# Patient Record
Sex: Male | Born: 1960 | Race: Black or African American | Hispanic: No | Marital: Single | Smoking: Current every day smoker
Health system: Southern US, Community
[De-identification: ages and names within clinical notes are randomized; demographics above are authoritative.]

## PROBLEM LIST (undated history)

## (undated) DIAGNOSIS — R011 Cardiac murmur, unspecified: Secondary | ICD-10-CM

## (undated) DIAGNOSIS — B1920 Unspecified viral hepatitis C without hepatic coma: Secondary | ICD-10-CM

## (undated) DIAGNOSIS — W3400XA Accidental discharge from unspecified firearms or gun, initial encounter: Secondary | ICD-10-CM

## (undated) DIAGNOSIS — I10 Essential (primary) hypertension: Secondary | ICD-10-CM

## (undated) HISTORY — PX: CERVICAL SPINE SURGERY: SHX589

## (undated) HISTORY — PX: LEG SURGERY: SHX1003

---

## 2001-11-23 ENCOUNTER — Emergency Department (HOSPITAL_COMMUNITY): Admission: EM | Admit: 2001-11-23 | Discharge: 2001-11-24 | Payer: Self-pay

## 2001-11-25 ENCOUNTER — Emergency Department (HOSPITAL_COMMUNITY): Admission: EM | Admit: 2001-11-25 | Discharge: 2001-11-25 | Payer: Self-pay | Admitting: Emergency Medicine

## 2005-07-17 ENCOUNTER — Emergency Department (HOSPITAL_COMMUNITY): Admission: EM | Admit: 2005-07-17 | Discharge: 2005-07-17 | Payer: Self-pay | Admitting: Emergency Medicine

## 2008-03-02 ENCOUNTER — Encounter: Admission: RE | Admit: 2008-03-02 | Discharge: 2008-03-23 | Payer: Self-pay | Admitting: *Deleted

## 2009-09-21 ENCOUNTER — Ambulatory Visit: Payer: Self-pay | Admitting: Gastroenterology

## 2010-03-01 ENCOUNTER — Emergency Department (HOSPITAL_COMMUNITY)
Admission: EM | Admit: 2010-03-01 | Discharge: 2010-03-01 | Payer: Self-pay | Source: Home / Self Care | Admitting: Emergency Medicine

## 2010-03-23 ENCOUNTER — Emergency Department (HOSPITAL_COMMUNITY)
Admission: EM | Admit: 2010-03-23 | Discharge: 2010-03-23 | Payer: Self-pay | Source: Home / Self Care | Admitting: Emergency Medicine

## 2010-03-30 ENCOUNTER — Encounter
Admission: RE | Admit: 2010-03-30 | Discharge: 2010-04-03 | Payer: Self-pay | Source: Home / Self Care | Attending: Neurosurgery | Admitting: Neurosurgery

## 2010-04-10 ENCOUNTER — Ambulatory Visit: Payer: Medicaid Other | Attending: Neurosurgery | Admitting: Rehabilitative and Restorative Service Providers"

## 2010-04-10 DIAGNOSIS — M25519 Pain in unspecified shoulder: Secondary | ICD-10-CM | POA: Insufficient documentation

## 2010-04-10 DIAGNOSIS — IMO0001 Reserved for inherently not codable concepts without codable children: Secondary | ICD-10-CM | POA: Insufficient documentation

## 2010-04-10 DIAGNOSIS — M2560 Stiffness of unspecified joint, not elsewhere classified: Secondary | ICD-10-CM | POA: Insufficient documentation

## 2010-04-10 DIAGNOSIS — M542 Cervicalgia: Secondary | ICD-10-CM | POA: Insufficient documentation

## 2010-04-16 ENCOUNTER — Ambulatory Visit: Payer: Medicaid Other | Admitting: Physical Therapy

## 2010-04-18 ENCOUNTER — Ambulatory Visit: Payer: Medicaid Other | Admitting: Physical Therapy

## 2010-04-21 ENCOUNTER — Emergency Department (HOSPITAL_COMMUNITY)
Admission: EM | Admit: 2010-04-21 | Discharge: 2010-04-21 | Disposition: A | Discharge status: Home / Self Care | Payer: Medicaid Other | Attending: Emergency Medicine | Admitting: Emergency Medicine

## 2010-04-21 DIAGNOSIS — B1920 Unspecified viral hepatitis C without hepatic coma: Secondary | ICD-10-CM | POA: Insufficient documentation

## 2010-04-21 DIAGNOSIS — M5412 Radiculopathy, cervical region: Secondary | ICD-10-CM | POA: Insufficient documentation

## 2010-04-21 DIAGNOSIS — I10 Essential (primary) hypertension: Secondary | ICD-10-CM | POA: Insufficient documentation

## 2010-04-21 DIAGNOSIS — Z760 Encounter for issue of repeat prescription: Secondary | ICD-10-CM | POA: Insufficient documentation

## 2010-04-26 ENCOUNTER — Ambulatory Visit: Payer: Medicaid Other | Admitting: Gastroenterology

## 2010-04-26 DIAGNOSIS — B182 Chronic viral hepatitis C: Secondary | ICD-10-CM

## 2010-05-24 ENCOUNTER — Encounter (HOSPITAL_COMMUNITY)
Admission: RE | Admit: 2010-05-24 | Discharge: 2010-05-24 | Disposition: A | Discharge status: Home / Self Care | Payer: Medicaid Other | Source: Ambulatory Visit | Attending: Neurosurgery | Admitting: Neurosurgery

## 2010-05-24 ENCOUNTER — Ambulatory Visit (HOSPITAL_COMMUNITY)
Admission: RE | Admit: 2010-05-24 | Discharge: 2010-05-24 | Disposition: A | Discharge status: Home / Self Care | Payer: Medicaid Other | Source: Ambulatory Visit | Attending: Neurosurgery | Admitting: Neurosurgery

## 2010-05-24 ENCOUNTER — Other Ambulatory Visit (HOSPITAL_COMMUNITY): Payer: Self-pay | Admitting: Neurosurgery

## 2010-05-24 DIAGNOSIS — Z01818 Encounter for other preprocedural examination: Secondary | ICD-10-CM | POA: Insufficient documentation

## 2010-05-24 DIAGNOSIS — M47812 Spondylosis without myelopathy or radiculopathy, cervical region: Secondary | ICD-10-CM

## 2010-05-24 DIAGNOSIS — Z01810 Encounter for preprocedural cardiovascular examination: Secondary | ICD-10-CM | POA: Insufficient documentation

## 2010-05-24 DIAGNOSIS — I10 Essential (primary) hypertension: Secondary | ICD-10-CM | POA: Insufficient documentation

## 2010-05-24 DIAGNOSIS — Z01812 Encounter for preprocedural laboratory examination: Secondary | ICD-10-CM | POA: Insufficient documentation

## 2010-05-24 LAB — BASIC METABOLIC PANEL
BUN: 10 mg/dL (ref 6–23)
Calcium: 9.4 mg/dL (ref 8.4–10.5)
Creatinine, Ser: 0.82 mg/dL (ref 0.4–1.5)
GFR calc non Af Amer: >60 mL/min (ref >60)
Glucose, Bld: 122 mg/dL — ABNORMAL HIGH (ref 70–99)
Potassium: 3.5 mEq/L (ref 3.5–5.1)

## 2010-05-24 LAB — CBC
HCT: 41.9 % (ref 39.0–52.0)
Hemoglobin: 14.8 g/dL (ref 13.0–17.0)
MCH: 31.3 pg (ref 26.0–34.0)
MCHC: 35.3 g/dL (ref 30.0–36.0)

## 2010-05-24 LAB — SURGICAL PCR SCREEN: Staphylococcus aureus: POSITIVE — AB

## 2010-06-01 ENCOUNTER — Inpatient Hospital Stay (HOSPITAL_COMMUNITY): Payer: Medicaid Other

## 2010-06-01 ENCOUNTER — Inpatient Hospital Stay (HOSPITAL_COMMUNITY)
Admission: RE | Admit: 2010-06-01 | Discharge: 2010-06-02 | DRG: 473 | Disposition: A | Discharge status: Home / Self Care | Payer: Medicaid Other | Source: Ambulatory Visit | Attending: Neurosurgery | Admitting: Neurosurgery

## 2010-06-01 DIAGNOSIS — Z01812 Encounter for preprocedural laboratory examination: Secondary | ICD-10-CM

## 2010-06-01 DIAGNOSIS — M4712 Other spondylosis with myelopathy, cervical region: Principal | ICD-10-CM | POA: Diagnosis present

## 2010-06-01 DIAGNOSIS — F17200 Nicotine dependence, unspecified, uncomplicated: Secondary | ICD-10-CM | POA: Diagnosis present

## 2010-06-01 DIAGNOSIS — I10 Essential (primary) hypertension: Secondary | ICD-10-CM | POA: Diagnosis present

## 2010-06-07 NOTE — Op Note (Signed)
NAME:  Daniel Bruce, Daniel Bruce NO.:  [id number]  MEDICAL RECORD NO.:  [id number]           PATIENT TYPE:  I  LOCATION:  3526                         FACILITY:  [facility]  PHYSICIAN:  Donalee Citrin, M.D.        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  PROCEDURE:  Anterior cervical diskectomies and fusion at C3-4, C4-5, and C5-6 using the Globus PEEK cages packed with locally harvested autograft mixed with Actifuse and an Atlantis translational plate with 8 fixed angle 30-mm screws.  SURGEON:  Donalee Citrin, M.D.  Threasa HeadsYetta Barre.  ANESTHESIA:  General endotracheal.  HISTORY OF PRESENT ILLNESS:  The patient is a very pleasant 50 year old gentleman who has had progressive worsening of neck and predominantly right shoulder pain bringing down to his forearm that was refractory to all forms of treatment.  MRI scan showed severe cervical stenosis from a combination of osteophytes, disk bulges, and kyphosis predominantly centered around the C3-4, C4-5, and C5-6 disk spaces, so due to the patient's failure to conservative treatment, MRI findings, and clinical exam, the patient was recommended anterior cervical diskectomies and fusion.  We explained the risks and benefits of the operation.  The patient understood and agreed to proceed forth.  The patient was brought into the OR, induced under anesthesia, positioned supine and the neck in slight extension, and 5 pounds of halter traction.  The right side of his neck was prepped and draped in the usual sterile fashion.  Preoperative x-ray localized the appropriate level, so a curvilinear incision was made just above the midline to the anterior border of sternocleidomastoid and superficial layer of the platysma.  The platysma was dissected and divided  longitudinally.  The avascular plane to sternomastoid and strap muscle was developed down to the prevertebral fascia.  The prevertebral fascia was dissected with the Kittner.  Intraoperative x-ray identified the C4-5 disk space, so annulotomy was made with a 15 blade scalpel to mark the disk space and self-retaining retractor was placed after the longus colli was reflected laterally.  All the three disk spaces were incised.  Large anterior osteophytes were bitten off with Leksell rongeur and 2- and 3-mm Kerrison punch.  All the three disk spaces were then sequentially drilled down the posterior  annulus and osteophyte complex capturing the bone shavings in a mucus trap.  Then at this point, the operating microscope was draped, brought into the field under microscopic illumination first working at C3-4.  The C3-4 disk space was drilled down the posterior annulus and osteophyte complex.  Using 1- and 2-mm Kerrison punch aggressive underbiting of both C3 and C4 endplates was carried out identifying the PLL, which was removed in piecemeal fashion exposing the thecal sac.  Marked stenosis at this level coming off from a large bone spur, C3 vertebral body was predominantly visualized.  This was all aggressively underbitten decompressing the central canal and marching across laterally.  The C4 pedicle was palpated.  The C4 foramen was  unroofed to mark the lateral margin of the disk space.  After adequate decompression of this disk space level was achieved, an 8-mm PEEK cage packed with local autograft mixed with Actifuse was then inserted 2 mm deep to the anterior vertebral body line.  Next, working at C5-6 in a similar fashion, the disk space was drilled down capturing the shavings in mucus trap and then aggressive underbiting of both C5 and C6 vertebral bodies were carried out decompressing the central canal and marching across laterally.  C6 pedicles identified.  C6 nerve roots were unroofed.   Predominant uncinate hypertrophy at this level.  Then again an 8 graft was inserted in similar fashion.  Then working at C4-5, the C4-5 was drilled down the posterior annulus and osteophyte complex. Large bones were coming off the C4 vertebral body displacing the right side of the spinal cord and right C5 nerve root was also identified. This was porotic, means symptomatic radicular lesions and it was all aggressively underbitten and decompressed, decompressing both C5 neural foramen and again end plates were scraped.  An 8-mm PEEK cage was inserted.  At this point, the Atlantis translational plate was placed. All screws had excellent purchase.  Locking mechanisms were engaged. The wound was then copiously irrigated and meticulous hemostasis was maintained.  A 7-mm fully fluted flat JP was placed and the wound was closed in layers with interrupted Vicryl and skin was closed with a running 4-0 subcuticular.  Benzoin and Steri-Strips were applied.  The patient went to recovery room in stable condition.  At the end of the case, sponge and instrument count were correct.          ______________________________ Donalee Citrin, M.D.     GC/MEDQ  D:  06/01/2010  T:  06/01/2010  Job:  [id number]  Electronically Signed by Donalee Citrin M.D. on 06/07/2010 08:37:03 AM

## 2010-07-12 ENCOUNTER — Other Ambulatory Visit: Payer: Self-pay | Admitting: Neurosurgery

## 2010-07-12 ENCOUNTER — Ambulatory Visit
Admission: RE | Admit: 2010-07-12 | Discharge: 2010-07-12 | Disposition: A | Discharge status: Home / Self Care | Payer: Medicaid Other | Source: Ambulatory Visit | Attending: Neurosurgery | Admitting: Neurosurgery

## 2010-07-12 DIAGNOSIS — M542 Cervicalgia: Secondary | ICD-10-CM

## 2010-07-16 ENCOUNTER — Ambulatory Visit
Admission: RE | Admit: 2010-07-16 | Discharge: 2010-07-16 | Disposition: A | Discharge status: Home / Self Care | Payer: Medicaid Other | Source: Ambulatory Visit | Attending: Neurosurgery | Admitting: Neurosurgery

## 2010-07-16 DIAGNOSIS — M542 Cervicalgia: Secondary | ICD-10-CM

## 2010-08-21 ENCOUNTER — Emergency Department (HOSPITAL_COMMUNITY)
Admission: EM | Admit: 2010-08-21 | Discharge: 2010-08-21 | Disposition: A | Discharge status: Home / Self Care | Payer: Medicaid Other | Attending: Emergency Medicine | Admitting: Emergency Medicine

## 2010-08-21 DIAGNOSIS — B1920 Unspecified viral hepatitis C without hepatic coma: Secondary | ICD-10-CM | POA: Insufficient documentation

## 2010-08-21 DIAGNOSIS — M542 Cervicalgia: Secondary | ICD-10-CM | POA: Insufficient documentation

## 2010-08-21 DIAGNOSIS — R110 Nausea: Secondary | ICD-10-CM | POA: Insufficient documentation

## 2010-08-21 DIAGNOSIS — I10 Essential (primary) hypertension: Secondary | ICD-10-CM | POA: Insufficient documentation

## 2010-08-21 DIAGNOSIS — G8929 Other chronic pain: Secondary | ICD-10-CM | POA: Insufficient documentation

## 2010-08-21 DIAGNOSIS — M5412 Radiculopathy, cervical region: Secondary | ICD-10-CM | POA: Insufficient documentation

## 2010-08-30 ENCOUNTER — Ambulatory Visit
Admission: RE | Admit: 2010-08-30 | Discharge: 2010-08-30 | Disposition: A | Discharge status: Home / Self Care | Payer: Medicaid Other | Source: Ambulatory Visit | Attending: Neurosurgery | Admitting: Neurosurgery

## 2010-08-30 ENCOUNTER — Other Ambulatory Visit: Payer: Self-pay | Admitting: Neurosurgery

## 2010-08-30 DIAGNOSIS — M542 Cervicalgia: Secondary | ICD-10-CM

## 2010-09-02 ENCOUNTER — Emergency Department (HOSPITAL_COMMUNITY)
Admission: EM | Admit: 2010-09-02 | Discharge: 2010-09-02 | Disposition: A | Discharge status: Home / Self Care | Payer: Medicaid Other | Attending: Emergency Medicine | Admitting: Emergency Medicine

## 2010-09-02 DIAGNOSIS — M129 Arthropathy, unspecified: Secondary | ICD-10-CM | POA: Insufficient documentation

## 2010-09-02 DIAGNOSIS — Z8619 Personal history of other infectious and parasitic diseases: Secondary | ICD-10-CM | POA: Insufficient documentation

## 2010-09-02 DIAGNOSIS — M5412 Radiculopathy, cervical region: Secondary | ICD-10-CM | POA: Insufficient documentation

## 2010-09-02 DIAGNOSIS — I10 Essential (primary) hypertension: Secondary | ICD-10-CM | POA: Insufficient documentation

## 2010-09-02 DIAGNOSIS — Z79899 Other long term (current) drug therapy: Secondary | ICD-10-CM | POA: Insufficient documentation

## 2010-09-02 DIAGNOSIS — R209 Unspecified disturbances of skin sensation: Secondary | ICD-10-CM | POA: Insufficient documentation

## 2010-09-02 DIAGNOSIS — M79609 Pain in unspecified limb: Secondary | ICD-10-CM | POA: Insufficient documentation

## 2010-09-02 DIAGNOSIS — M542 Cervicalgia: Secondary | ICD-10-CM | POA: Insufficient documentation

## 2010-09-10 ENCOUNTER — Emergency Department (HOSPITAL_COMMUNITY)
Admission: EM | Admit: 2010-09-10 | Discharge: 2010-09-10 | Disposition: A | Discharge status: Home / Self Care | Payer: Medicaid Other | Attending: Emergency Medicine | Admitting: Emergency Medicine

## 2010-09-10 DIAGNOSIS — M542 Cervicalgia: Secondary | ICD-10-CM | POA: Insufficient documentation

## 2010-09-10 DIAGNOSIS — I10 Essential (primary) hypertension: Secondary | ICD-10-CM | POA: Insufficient documentation

## 2010-09-10 DIAGNOSIS — Z87828 Personal history of other (healed) physical injury and trauma: Secondary | ICD-10-CM | POA: Insufficient documentation

## 2010-09-10 DIAGNOSIS — Z79899 Other long term (current) drug therapy: Secondary | ICD-10-CM | POA: Insufficient documentation

## 2010-09-10 DIAGNOSIS — G8929 Other chronic pain: Secondary | ICD-10-CM | POA: Insufficient documentation

## 2010-09-19 ENCOUNTER — Emergency Department (HOSPITAL_COMMUNITY)
Admission: EM | Admit: 2010-09-19 | Discharge: 2010-09-19 | Disposition: A | Discharge status: Home / Self Care | Payer: Medicaid Other | Attending: Emergency Medicine | Admitting: Emergency Medicine

## 2010-09-19 DIAGNOSIS — Z8619 Personal history of other infectious and parasitic diseases: Secondary | ICD-10-CM | POA: Insufficient documentation

## 2010-09-19 DIAGNOSIS — Z79899 Other long term (current) drug therapy: Secondary | ICD-10-CM | POA: Insufficient documentation

## 2010-09-19 DIAGNOSIS — R509 Fever, unspecified: Secondary | ICD-10-CM | POA: Insufficient documentation

## 2010-09-19 DIAGNOSIS — I10 Essential (primary) hypertension: Secondary | ICD-10-CM | POA: Insufficient documentation

## 2010-09-19 DIAGNOSIS — M129 Arthropathy, unspecified: Secondary | ICD-10-CM | POA: Insufficient documentation

## 2010-09-19 DIAGNOSIS — R209 Unspecified disturbances of skin sensation: Secondary | ICD-10-CM | POA: Insufficient documentation

## 2010-09-19 DIAGNOSIS — M542 Cervicalgia: Secondary | ICD-10-CM | POA: Insufficient documentation

## 2010-09-21 ENCOUNTER — Encounter (HOSPITAL_COMMUNITY)
Admission: RE | Admit: 2010-09-21 | Discharge: 2010-09-21 | Disposition: A | Discharge status: Home / Self Care | Payer: Medicaid Other | Source: Ambulatory Visit | Attending: Neurosurgery | Admitting: Neurosurgery

## 2010-09-21 LAB — BASIC METABOLIC PANEL
CO2: 32 mEq/L (ref 19–32)
Calcium: 9.8 mg/dL (ref 8.4–10.5)
Creatinine, Ser: 0.92 mg/dL (ref 0.50–1.35)
GFR calc non Af Amer: >60 mL/min (ref >60)
Sodium: 140 mEq/L (ref 135–145)

## 2010-09-21 LAB — CBC
MCH: 31.8 pg (ref 26.0–34.0)
MCHC: 36 g/dL (ref 30.0–36.0)
MCV: 88.4 fL (ref 78.0–100.0)
Platelets: 215 10*3/uL (ref 150–400)
RBC: 4.4 MIL/uL (ref 4.22–5.81)

## 2010-09-21 LAB — SURGICAL PCR SCREEN: MRSA, PCR: NEGATIVE

## 2010-09-26 ENCOUNTER — Observation Stay (HOSPITAL_COMMUNITY): Payer: Medicaid Other

## 2010-09-26 ENCOUNTER — Inpatient Hospital Stay (HOSPITAL_COMMUNITY)
Admission: RE | Admit: 2010-09-26 | Discharge: 2010-09-27 | DRG: 473 | Disposition: A | Discharge status: Home / Self Care | Payer: Medicaid Other | Source: Ambulatory Visit | Attending: Neurosurgery | Admitting: Neurosurgery

## 2010-09-26 DIAGNOSIS — Z472 Encounter for removal of internal fixation device: Secondary | ICD-10-CM

## 2010-09-26 DIAGNOSIS — Z981 Arthrodesis status: Secondary | ICD-10-CM

## 2010-09-26 DIAGNOSIS — I10 Essential (primary) hypertension: Secondary | ICD-10-CM | POA: Diagnosis present

## 2010-09-26 DIAGNOSIS — M5020 Other cervical disc displacement, unspecified cervical region: Principal | ICD-10-CM | POA: Diagnosis present

## 2010-09-26 DIAGNOSIS — M129 Arthropathy, unspecified: Secondary | ICD-10-CM | POA: Diagnosis present

## 2010-09-26 DIAGNOSIS — F17200 Nicotine dependence, unspecified, uncomplicated: Secondary | ICD-10-CM | POA: Diagnosis present

## 2010-09-26 DIAGNOSIS — Z01812 Encounter for preprocedural laboratory examination: Secondary | ICD-10-CM

## 2010-09-26 DIAGNOSIS — Z79899 Other long term (current) drug therapy: Secondary | ICD-10-CM

## 2010-10-03 ENCOUNTER — Emergency Department (HOSPITAL_COMMUNITY)
Admission: EM | Admit: 2010-10-03 | Discharge: 2010-10-03 | Disposition: A | Discharge status: Home / Self Care | Payer: Medicaid Other | Attending: Emergency Medicine | Admitting: Emergency Medicine

## 2010-10-03 ENCOUNTER — Emergency Department (HOSPITAL_COMMUNITY): Payer: Medicaid Other

## 2010-10-03 DIAGNOSIS — M542 Cervicalgia: Secondary | ICD-10-CM | POA: Insufficient documentation

## 2010-10-03 DIAGNOSIS — Z8619 Personal history of other infectious and parasitic diseases: Secondary | ICD-10-CM | POA: Insufficient documentation

## 2010-10-03 DIAGNOSIS — R079 Chest pain, unspecified: Secondary | ICD-10-CM | POA: Insufficient documentation

## 2010-10-03 DIAGNOSIS — M129 Arthropathy, unspecified: Secondary | ICD-10-CM | POA: Insufficient documentation

## 2010-10-03 DIAGNOSIS — G8918 Other acute postprocedural pain: Secondary | ICD-10-CM | POA: Insufficient documentation

## 2010-10-03 DIAGNOSIS — I10 Essential (primary) hypertension: Secondary | ICD-10-CM | POA: Insufficient documentation

## 2010-10-03 DIAGNOSIS — Z79899 Other long term (current) drug therapy: Secondary | ICD-10-CM | POA: Insufficient documentation

## 2010-10-03 LAB — CBC
HCT: 39.8 % (ref 39.0–52.0)
MCHC: 34.9 g/dL (ref 30.0–36.0)
MCV: 87.3 fL (ref 78.0–100.0)
Platelets: 286 10*3/uL (ref 150–400)
RDW: 12.5 % (ref 11.5–15.5)

## 2010-10-03 LAB — DIFFERENTIAL
Basophils Absolute: 0 10*3/uL (ref 0.0–0.1)
Eosinophils Absolute: 0.1 10*3/uL (ref 0.0–0.7)
Eosinophils Relative: 3 % (ref 0–5)
Lymphocytes Relative: 46 % (ref 12–46)
Lymphs Abs: 1.7 10*3/uL (ref 0.7–4.0)
Monocytes Absolute: 0.6 10*3/uL (ref 0.1–1.0)

## 2010-10-03 LAB — BASIC METABOLIC PANEL
CO2: 30 mEq/L (ref 19–32)
Creatinine, Ser: 0.67 mg/dL (ref 0.50–1.35)
GFR calc Af Amer: >60 mL/min (ref >60)

## 2010-10-03 LAB — TROPONIN I: Troponin I: <0.3 ng/mL (ref <0.30)

## 2010-10-03 MED ORDER — IOHEXOL 300 MG/ML  SOLN: 80.0000 mL | Freq: Once | INTRAMUSCULAR | Status: DC | PRN

## 2010-10-17 NOTE — Op Note (Signed)
NAME:  Daniel Bruce, Daniel Bruce NO.:  [id number]  MEDICAL RECORD NO.:  [id number]  LOCATION:  3533                         FACILITY:  [facility]  PHYSICIAN:  Donalee Citrin, M.D.        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  PROCEDURE: 1. Anterior cervical diskectomy and fusion at C6-7 using the Globus     PEEK cage, packed with locally harvested autograft mixed with     Actifuse, and an Atlantis Venture plate with two 16-XW rescue     screws and two 32-mm variable angle screws. 2. Exploration of fusion, removal of hardware, C3-C6.  SURGEON:  Donalee Citrin, MD.  ASSISTANT:  Tia Alert, MD.  ANESTHESIA:  General endotracheal.  HISTORY OF PRESENT ILLNESS:  The patient is a very pleasant 50-year gentleman, who several months ago underwent ACDF at C3-C6, very successfully has solid bony fusion.  However, several weeks to months ago, he developed progressive right arm pain consistent with C7 nerve root pattern.  Workup with MRI scan revealed a large disk herniation displacing the right C7 nerve root and the disk space below the fusion at C6-7.  The patient had weakness of his triceps.  It was recommended anterior cervical diskectomy and fusion after imaging proved or showed that the C3-C6 fusion appeared to be solid.  The patient is recommended exploration of fusion, removal of hardware, and ACDF at C6-7.  Risks and benefits of the operation were explained to the patient.  He understood and agreed to proceed forward.  DESCRIPTION OF PROCEDURE:  The patient was brought to the OR, induced under general anesthesia, positioned supine, with the neck in slight extension with 5 pounds of Halter traction.  The right side of his neck was prepped and draped in usual sterile fashion.  His old incision was lined up with  x-ray and felt to be adequate for doing the C6-7 disk spaces as well as removing the hardware from C3-C6, so his old incision was opened up and scar tissues dissected free and this was divided longitudinally.  The avascular plane to sternomastoid and strap muscle was developed down to the prevertebral fascia.  Prevertebral fascia was dissected off the old plate.  The locking mechanisms were disengaged. Screws removed and the plate was removed.  Fusion was inspected and felt to be solid.  Screw holes were then waxed.  The C6-7 disk space was identified.  Longus colli was reflected laterally and self-retaining retractor was placed.  There was a large anterior osteophyte coming off the C6 vertebral bodies, was bitten away with 2-mm Kerrison punch. Then, the disk space was drilled down, capturing bone shavings in mucus trap.  Then, the operating microscope was draped, brought into the field under microscopic illumination, the undersurface of both endplates were further underbitten the posterior annulus and posterior longitudinal ligament was removed in piecemeal fashion.  This identified the dura and aggressive underbiting at both endplates, decompressed the central canal.  First, the left side C7 pedicle was identified, the left C7 nerve roots were  skeletonized, flush to pedicle, and marching across through the right C7 neural foramen.  Several large free fragment disks were immediately teased out the neural foramen with a nerve hook.  This decompressed the C7 nerve root on the right, and then after adequate underbiting of both endplates, the central decompression was achieved and both foramina were easily accepting the nerve hook confirm patency. The wound was then copiously irrigated.  Meticulous hemostasis was maintained.  The endplates were scraped and an 8-mm PEEK cage, packed with locally harvested autograft mixed with Actifuse was inserted, approximately 2 mm deep to the anterior  vertebral line.  Then, a 27-mm Atlantis Venture plate was placed.  Two rescue screws were placed in the old holes of T6.  Two new screw holes were drilled at C7.  All screws had excellent purchase.  Locking mechanism was engaged.  Then, JP drain was placed.  The wound was closed in layers with Vicryl in the platysma and a running 4-0 subcuticular.  Benzoin and Steri-Strips were applied. The patient went to recovery room in stable condition.  At the end of the case, sponge and instrument counts were correct.          ______________________________ Donalee Citrin, M.D.     GC/MEDQ  D:  09/26/2010  T:  09/26/2010  Job:  [id number]  Electronically Signed by Donalee Citrin M.D. on 10/17/2010 10:17:21 AM
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
## 2010-10-25 ENCOUNTER — Other Ambulatory Visit: Payer: Medicaid Other | Admitting: Gastroenterology

## 2010-10-25 DIAGNOSIS — B182 Chronic viral hepatitis C: Secondary | ICD-10-CM

## 2010-10-30 ENCOUNTER — Ambulatory Visit
Admission: RE | Admit: 2010-10-30 | Discharge: 2010-10-30 | Disposition: A | Discharge status: Home / Self Care | Payer: Medicaid Other | Source: Ambulatory Visit | Attending: Neurosurgery | Admitting: Neurosurgery

## 2010-10-30 ENCOUNTER — Other Ambulatory Visit: Payer: Self-pay | Admitting: Neurosurgery

## 2010-10-30 DIAGNOSIS — M542 Cervicalgia: Secondary | ICD-10-CM

## 2010-11-29 NOTE — Discharge Summary (Signed)
  NAME:  Daniel Bruce, Daniel Bruce NO.:  [id number]  MEDICAL RECORD NO.:  [id number]  LOCATION:  3533                         FACILITY:  [facility]  PHYSICIAN:  Donalee Citrin, M.D.        DATE OF BIRTH:  20-Jul-1960  DATE OF ADMISSION:  09/26/2010 DATE OF DISCHARGE:  09/27/2010                              DISCHARGE SUMMARY   ADMITTING DIAGNOSIS:  Cervical spondylosis and myeloradiculopathy.  DISCHARGE DIAGNOSIS:  Cervical spondylosis and myeloradiculopathy.  HOSPITAL COURSE:  The patient's 50-year gentleman who was admitted as an early-morning admission, went to the operating room with the aforementioned procedure.  Postoperatively, the patient did very well, recovering in the floor.  On the floor, the patient was ambulating and voiding spontaneously, tolerated regular diet.  Arm pain was significantly improved.  Neck pain was also better.  His JP drain was discontinued on the day of discharge.  His wound was clean and dry and he was discharged with scheduled followup in approximately 2 weeks.          ______________________________ Donalee Citrin, M.D.     GC/MEDQ  D:  10/17/2010  T:  10/17/2010  Job:  [id number]  Electronically Signed by Donalee Citrin M.D. on 11/29/2010 01:03:12 PM

## 2010-12-11 ENCOUNTER — Other Ambulatory Visit: Payer: Self-pay | Admitting: Neurosurgery

## 2010-12-11 ENCOUNTER — Ambulatory Visit
Admission: RE | Admit: 2010-12-11 | Discharge: 2010-12-11 | Disposition: A | Discharge status: Home / Self Care | Payer: Medicaid Other | Source: Ambulatory Visit | Attending: Neurosurgery | Admitting: Neurosurgery

## 2010-12-11 DIAGNOSIS — M542 Cervicalgia: Secondary | ICD-10-CM

## 2011-03-12 ENCOUNTER — Other Ambulatory Visit: Payer: Self-pay | Admitting: Neurosurgery

## 2011-03-12 ENCOUNTER — Ambulatory Visit
Admission: RE | Admit: 2011-03-12 | Discharge: 2011-03-12 | Disposition: A | Discharge status: Home / Self Care | Payer: Medicaid Other | Source: Ambulatory Visit | Attending: Neurosurgery | Admitting: Neurosurgery

## 2011-03-12 DIAGNOSIS — M542 Cervicalgia: Secondary | ICD-10-CM

## 2011-12-18 ENCOUNTER — Other Ambulatory Visit: Payer: Self-pay | Admitting: Neurosurgery

## 2011-12-18 DIAGNOSIS — M545 Low back pain: Secondary | ICD-10-CM

## 2011-12-23 ENCOUNTER — Ambulatory Visit
Admission: RE | Admit: 2011-12-23 | Discharge: 2011-12-23 | Disposition: A | Discharge status: Home / Self Care | Payer: Medicaid Other | Source: Ambulatory Visit | Attending: Neurosurgery | Admitting: Neurosurgery

## 2011-12-23 DIAGNOSIS — M545 Low back pain: Secondary | ICD-10-CM

## 2012-06-25 ENCOUNTER — Emergency Department (HOSPITAL_COMMUNITY)
Admission: EM | Admit: 2012-06-25 | Discharge: 2012-06-25 | Disposition: A | Discharge status: Home / Self Care | Payer: Medicaid Other | Attending: Emergency Medicine | Admitting: Emergency Medicine

## 2012-06-25 ENCOUNTER — Encounter (HOSPITAL_COMMUNITY): Payer: Self-pay

## 2012-06-25 DIAGNOSIS — Z87828 Personal history of other (healed) physical injury and trauma: Secondary | ICD-10-CM | POA: Insufficient documentation

## 2012-06-25 DIAGNOSIS — I10 Essential (primary) hypertension: Secondary | ICD-10-CM | POA: Insufficient documentation

## 2012-06-25 DIAGNOSIS — M549 Dorsalgia, unspecified: Secondary | ICD-10-CM | POA: Insufficient documentation

## 2012-06-25 DIAGNOSIS — F17200 Nicotine dependence, unspecified, uncomplicated: Secondary | ICD-10-CM | POA: Insufficient documentation

## 2012-06-25 DIAGNOSIS — R011 Cardiac murmur, unspecified: Secondary | ICD-10-CM | POA: Insufficient documentation

## 2012-06-25 HISTORY — DX: Accidental discharge from unspecified firearms or gun, initial encounter: W34.00XA

## 2012-06-25 HISTORY — DX: Cardiac murmur, unspecified: R01.1

## 2012-06-25 HISTORY — DX: Essential (primary) hypertension: I10

## 2012-06-25 MED ORDER — IBUPROFEN 800 MG PO TABS: 800.0000 mg | ORAL_TABLET | Freq: Three times a day (TID) | ORAL | Status: DC

## 2012-06-25 MED ORDER — OXYCODONE-ACETAMINOPHEN 5-325 MG PO TABS: ORAL_TABLET | ORAL | Status: DC
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
## 2012-06-25 NOTE — ED Notes (Signed)
Patient states that he went to his doctor today and was terminated from seeing him.  Patient states " my girlfriend gave me medicine to help me sleep and I failed the piss test.  My doctor didn't listen to anything I said and he just dropped me".
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
## 2012-06-27 NOTE — ED Provider Notes (Signed)
Medical screening examination/treatment/procedure(s) were performed by non-physician practitioner and as supervising physician I was immediately available for consultation/collaboration.   Lyanne Co, MD 06/27/12 1131

## 2012-08-08 ENCOUNTER — Emergency Department (HOSPITAL_COMMUNITY)
Admission: EM | Admit: 2012-08-08 | Discharge: 2012-08-08 | Disposition: A | Discharge status: Home / Self Care | Payer: Medicaid Other | Attending: Emergency Medicine | Admitting: Emergency Medicine

## 2012-08-08 ENCOUNTER — Encounter (HOSPITAL_COMMUNITY): Payer: Self-pay | Admitting: Emergency Medicine

## 2012-08-08 DIAGNOSIS — K029 Dental caries, unspecified: Secondary | ICD-10-CM | POA: Insufficient documentation

## 2012-08-08 DIAGNOSIS — Z87828 Personal history of other (healed) physical injury and trauma: Secondary | ICD-10-CM | POA: Insufficient documentation

## 2012-08-08 DIAGNOSIS — R011 Cardiac murmur, unspecified: Secondary | ICD-10-CM | POA: Insufficient documentation

## 2012-08-08 DIAGNOSIS — K0889 Other specified disorders of teeth and supporting structures: Secondary | ICD-10-CM

## 2012-08-08 DIAGNOSIS — K089 Disorder of teeth and supporting structures, unspecified: Secondary | ICD-10-CM | POA: Insufficient documentation

## 2012-08-08 DIAGNOSIS — I10 Essential (primary) hypertension: Secondary | ICD-10-CM | POA: Insufficient documentation

## 2012-08-08 DIAGNOSIS — F17200 Nicotine dependence, unspecified, uncomplicated: Secondary | ICD-10-CM | POA: Insufficient documentation

## 2012-08-08 MED ORDER — OXYCODONE-ACETAMINOPHEN 5-325 MG PO TABS: 1.0000 | ORAL_TABLET | ORAL | Status: DC | PRN

## 2012-08-08 MED ORDER — KETOROLAC TROMETHAMINE 60 MG/2ML IM SOLN
60.0000 mg | Freq: Once | INTRAMUSCULAR | Status: AC
Start: 2012-08-08 — End: 2012-08-08
  Administered 2012-08-08: 60 mg via INTRAMUSCULAR
  Filled 2012-08-08: qty 2

## 2012-08-08 MED ORDER — AMOXICILLIN 500 MG PO CAPS: 500.0000 mg | ORAL_CAPSULE | Freq: Two times a day (BID) | ORAL | Status: DC

## 2012-08-08 NOTE — ED Provider Notes (Signed)
Medical screening examination/treatment/procedure(s) were performed by non-physician practitioner and as supervising physician I was immediately available for consultation/collaboration.    Gilda Crease, MD 08/08/12 1911

## 2012-08-08 NOTE — ED Notes (Signed)
Pt states he has been having frontal dental pain for past few weeks.  Pt states R upper front teeth and 2 teeth on the bottom jaw are hurting.  Pt denies foul taste in mouth.
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
## 2012-09-05 ENCOUNTER — Encounter (HOSPITAL_COMMUNITY): Payer: Self-pay | Admitting: *Deleted

## 2012-09-05 ENCOUNTER — Emergency Department (HOSPITAL_COMMUNITY)
Admission: EM | Admit: 2012-09-05 | Discharge: 2012-09-05 | Disposition: A | Discharge status: Home / Self Care | Payer: Medicaid Other | Attending: Emergency Medicine | Admitting: Emergency Medicine

## 2012-09-05 DIAGNOSIS — R011 Cardiac murmur, unspecified: Secondary | ICD-10-CM | POA: Insufficient documentation

## 2012-09-05 DIAGNOSIS — Z79899 Other long term (current) drug therapy: Secondary | ICD-10-CM | POA: Insufficient documentation

## 2012-09-05 DIAGNOSIS — K029 Dental caries, unspecified: Secondary | ICD-10-CM | POA: Insufficient documentation

## 2012-09-05 DIAGNOSIS — F17200 Nicotine dependence, unspecified, uncomplicated: Secondary | ICD-10-CM | POA: Insufficient documentation

## 2012-09-05 DIAGNOSIS — R599 Enlarged lymph nodes, unspecified: Secondary | ICD-10-CM | POA: Insufficient documentation

## 2012-09-05 DIAGNOSIS — I10 Essential (primary) hypertension: Secondary | ICD-10-CM | POA: Insufficient documentation

## 2012-09-05 MED ORDER — OXYCODONE-ACETAMINOPHEN 5-325 MG PO TABS: 1.0000 | ORAL_TABLET | ORAL | Status: DC | PRN

## 2012-09-05 MED ORDER — PENICILLIN V POTASSIUM 500 MG PO TABS: 500.0000 mg | ORAL_TABLET | Freq: Three times a day (TID) | ORAL | Status: AC

## 2012-09-05 NOTE — ED Provider Notes (Signed)
Medical screening examination/treatment/procedure(s) were performed by non-physician practitioner and as supervising physician I was immediately available for consultation/collaboration.   Dione Booze, MD 09/05/12 [phone]

## 2012-09-05 NOTE — ED Notes (Signed)
The pt has had   A toothache with broken teeth for 4 months
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
## 2012-09-14 ENCOUNTER — Emergency Department (HOSPITAL_COMMUNITY)
Admission: EM | Admit: 2012-09-14 | Discharge: 2012-09-14 | Disposition: A | Discharge status: Home / Self Care | Payer: Medicaid Other | Attending: Emergency Medicine | Admitting: Emergency Medicine

## 2012-09-14 ENCOUNTER — Encounter (HOSPITAL_COMMUNITY): Payer: Self-pay | Admitting: *Deleted

## 2012-09-14 DIAGNOSIS — Z79899 Other long term (current) drug therapy: Secondary | ICD-10-CM | POA: Insufficient documentation

## 2012-09-14 DIAGNOSIS — F17200 Nicotine dependence, unspecified, uncomplicated: Secondary | ICD-10-CM | POA: Insufficient documentation

## 2012-09-14 DIAGNOSIS — R011 Cardiac murmur, unspecified: Secondary | ICD-10-CM | POA: Insufficient documentation

## 2012-09-14 DIAGNOSIS — K089 Disorder of teeth and supporting structures, unspecified: Secondary | ICD-10-CM | POA: Insufficient documentation

## 2012-09-14 DIAGNOSIS — K0889 Other specified disorders of teeth and supporting structures: Secondary | ICD-10-CM

## 2012-09-14 DIAGNOSIS — I10 Essential (primary) hypertension: Secondary | ICD-10-CM | POA: Insufficient documentation

## 2012-09-14 MED ORDER — OXYCODONE-ACETAMINOPHEN 5-325 MG PO TABS: 2.0000 | ORAL_TABLET | Freq: Four times a day (QID) | ORAL | Status: DC | PRN

## 2012-09-14 MED ORDER — HYDROCODONE-ACETAMINOPHEN 5-325 MG PO TABS
2.0000 | ORAL_TABLET | Freq: Once | ORAL | Status: AC
  Administered 2012-09-14: 2 via ORAL
  Filled 2012-09-14: qty 2

## 2012-09-14 NOTE — ED Notes (Signed)
Pt states he has appointment to get teeth removed July 23rd. Here for pain meds.
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
## 2012-09-15 NOTE — ED Provider Notes (Signed)
Medical screening examination/treatment/procedure(s) were performed by non-physician practitioner and as supervising physician I was immediately available for consultation/collaboration.  Ashby Dawes, MD 09/15/12 [phone]

## 2012-09-28 ENCOUNTER — Emergency Department (HOSPITAL_COMMUNITY)
Admission: EM | Admit: 2012-09-28 | Discharge: 2012-09-28 | Disposition: A | Discharge status: Home / Self Care | Payer: Medicaid Other | Attending: Emergency Medicine | Admitting: Emergency Medicine

## 2012-09-28 ENCOUNTER — Encounter (HOSPITAL_COMMUNITY): Payer: Self-pay | Admitting: Cardiology

## 2012-09-28 DIAGNOSIS — I10 Essential (primary) hypertension: Secondary | ICD-10-CM | POA: Insufficient documentation

## 2012-09-28 DIAGNOSIS — F17200 Nicotine dependence, unspecified, uncomplicated: Secondary | ICD-10-CM | POA: Insufficient documentation

## 2012-09-28 DIAGNOSIS — X062XXA Exposure to ignition of other clothing and apparel, initial encounter: Secondary | ICD-10-CM | POA: Insufficient documentation

## 2012-09-28 DIAGNOSIS — Y9389 Activity, other specified: Secondary | ICD-10-CM | POA: Insufficient documentation

## 2012-09-28 DIAGNOSIS — T300 Burn of unspecified body region, unspecified degree: Secondary | ICD-10-CM

## 2012-09-28 DIAGNOSIS — R011 Cardiac murmur, unspecified: Secondary | ICD-10-CM | POA: Insufficient documentation

## 2012-09-28 DIAGNOSIS — Y92009 Unspecified place in unspecified non-institutional (private) residence as the place of occurrence of the external cause: Secondary | ICD-10-CM | POA: Insufficient documentation

## 2012-09-28 DIAGNOSIS — Z79899 Other long term (current) drug therapy: Secondary | ICD-10-CM | POA: Insufficient documentation

## 2012-09-28 DIAGNOSIS — T24239A Burn of second degree of unspecified lower leg, initial encounter: Secondary | ICD-10-CM | POA: Insufficient documentation

## 2012-09-28 MED ORDER — TRAMADOL HCL 50 MG PO TABS
50.0000 mg | ORAL_TABLET | Freq: Once | ORAL | Status: AC
  Administered 2012-09-28: 50 mg via ORAL
  Filled 2012-09-28: qty 1

## 2012-09-28 MED ORDER — SILVER SULFADIAZINE 1 % EX CREA
TOPICAL_CREAM | Freq: Once | CUTANEOUS | Status: AC
  Administered 2012-09-28: 17:00:00 via TOPICAL
  Filled 2012-09-28: qty 85

## 2012-09-28 MED ORDER — TRAMADOL HCL 50 MG PO TABS: 50.0000 mg | ORAL_TABLET | Freq: Four times a day (QID) | ORAL | Status: DC | PRN
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
## 2012-09-29 NOTE — ED Provider Notes (Signed)
Medical screening examination/treatment/procedure(s) were performed by non-physician practitioner and as supervising physician I was immediately available for consultation/collaboration.  Glorianne Proctor B. Fredericka Bottcher, MD 09/29/12 1156 

## 2012-10-03 ENCOUNTER — Encounter (HOSPITAL_COMMUNITY): Payer: Self-pay | Admitting: *Deleted

## 2012-10-03 ENCOUNTER — Emergency Department (HOSPITAL_COMMUNITY)
Admission: EM | Admit: 2012-10-03 | Discharge: 2012-10-03 | Disposition: A | Discharge status: Home / Self Care | Payer: Medicaid Other | Attending: Emergency Medicine | Admitting: Emergency Medicine

## 2012-10-03 DIAGNOSIS — F17200 Nicotine dependence, unspecified, uncomplicated: Secondary | ICD-10-CM | POA: Insufficient documentation

## 2012-10-03 DIAGNOSIS — Z9889 Other specified postprocedural states: Secondary | ICD-10-CM | POA: Insufficient documentation

## 2012-10-03 DIAGNOSIS — Y929 Unspecified place or not applicable: Secondary | ICD-10-CM | POA: Insufficient documentation

## 2012-10-03 DIAGNOSIS — Y939 Activity, unspecified: Secondary | ICD-10-CM | POA: Insufficient documentation

## 2012-10-03 DIAGNOSIS — R011 Cardiac murmur, unspecified: Secondary | ICD-10-CM | POA: Insufficient documentation

## 2012-10-03 DIAGNOSIS — Z87828 Personal history of other (healed) physical injury and trauma: Secondary | ICD-10-CM | POA: Insufficient documentation

## 2012-10-03 DIAGNOSIS — L02419 Cutaneous abscess of limb, unspecified: Secondary | ICD-10-CM | POA: Insufficient documentation

## 2012-10-03 DIAGNOSIS — T300 Burn of unspecified body region, unspecified degree: Secondary | ICD-10-CM

## 2012-10-03 DIAGNOSIS — X58XXXA Exposure to other specified factors, initial encounter: Secondary | ICD-10-CM | POA: Insufficient documentation

## 2012-10-03 DIAGNOSIS — Z79899 Other long term (current) drug therapy: Secondary | ICD-10-CM | POA: Insufficient documentation

## 2012-10-03 DIAGNOSIS — I10 Essential (primary) hypertension: Secondary | ICD-10-CM | POA: Insufficient documentation

## 2012-10-03 DIAGNOSIS — L0390 Cellulitis, unspecified: Secondary | ICD-10-CM

## 2012-10-03 DIAGNOSIS — T24239A Burn of second degree of unspecified lower leg, initial encounter: Secondary | ICD-10-CM | POA: Insufficient documentation

## 2012-10-03 MED ORDER — SULFAMETHOXAZOLE-TRIMETHOPRIM 800-160 MG PO TABS: 1.0000 | ORAL_TABLET | Freq: Two times a day (BID) | ORAL | Status: DC

## 2012-10-03 MED ORDER — HYDROCODONE-ACETAMINOPHEN 5-325 MG PO TABS: 1.0000 | ORAL_TABLET | ORAL | Status: DC | PRN

## 2012-10-03 MED ORDER — SILVER SULFADIAZINE 1 % EX CREA
TOPICAL_CREAM | Freq: Once | CUTANEOUS | Status: AC
  Administered 2012-10-03: 1 via TOPICAL
  Filled 2012-10-03: qty 85

## 2012-10-03 NOTE — ED Provider Notes (Signed)
Medical screening examination/treatment/procedure(s) were performed by non-physician practitioner and as supervising physician I was immediately available for consultation/collaboration.   Naelle Diegel B. Kemara Quigley, MD 10/03/12 1345 
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
## 2012-10-19 ENCOUNTER — Emergency Department (HOSPITAL_COMMUNITY)
Admission: EM | Admit: 2012-10-19 | Discharge: 2012-10-19 | Disposition: A | Discharge status: Home / Self Care | Payer: Medicaid Other | Attending: Emergency Medicine | Admitting: Emergency Medicine

## 2012-10-19 ENCOUNTER — Encounter (HOSPITAL_COMMUNITY): Payer: Self-pay | Admitting: Emergency Medicine

## 2012-10-19 DIAGNOSIS — Z79899 Other long term (current) drug therapy: Secondary | ICD-10-CM | POA: Insufficient documentation

## 2012-10-19 DIAGNOSIS — F17200 Nicotine dependence, unspecified, uncomplicated: Secondary | ICD-10-CM | POA: Insufficient documentation

## 2012-10-19 DIAGNOSIS — R011 Cardiac murmur, unspecified: Secondary | ICD-10-CM | POA: Insufficient documentation

## 2012-10-19 DIAGNOSIS — Z760 Encounter for issue of repeat prescription: Secondary | ICD-10-CM | POA: Insufficient documentation

## 2012-10-19 DIAGNOSIS — Z87828 Personal history of other (healed) physical injury and trauma: Secondary | ICD-10-CM | POA: Insufficient documentation

## 2012-10-19 DIAGNOSIS — G8929 Other chronic pain: Secondary | ICD-10-CM | POA: Insufficient documentation

## 2012-10-19 DIAGNOSIS — M79609 Pain in unspecified limb: Secondary | ICD-10-CM | POA: Insufficient documentation

## 2012-10-19 DIAGNOSIS — I10 Essential (primary) hypertension: Secondary | ICD-10-CM | POA: Insufficient documentation

## 2012-10-19 MED ORDER — HYDROCODONE-ACETAMINOPHEN 5-325 MG PO TABS
2.0000 | ORAL_TABLET | Freq: Once | ORAL | Status: AC
  Administered 2012-10-19: 2 via ORAL
  Filled 2012-10-19: qty 2
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
## 2012-10-19 NOTE — ED Provider Notes (Signed)
Medical screening examination/treatment/procedure(s) were performed by non-physician practitioner and as supervising physician I was immediately available for consultation/collaboration.   Clancey Welton W Jimmey Hengel, MD 10/19/12 2339 

## 2013-03-09 IMAGING — CR DG CHEST 2V
2 series · 2 of 2 positions shown · non-contrast
Comparison: None.

CLINICAL DATA: 50-year-old male preoperative study for spine
surgery.  Hypertension.

CHEST - 2 VIEW

[view not recorded (1 of 2)]
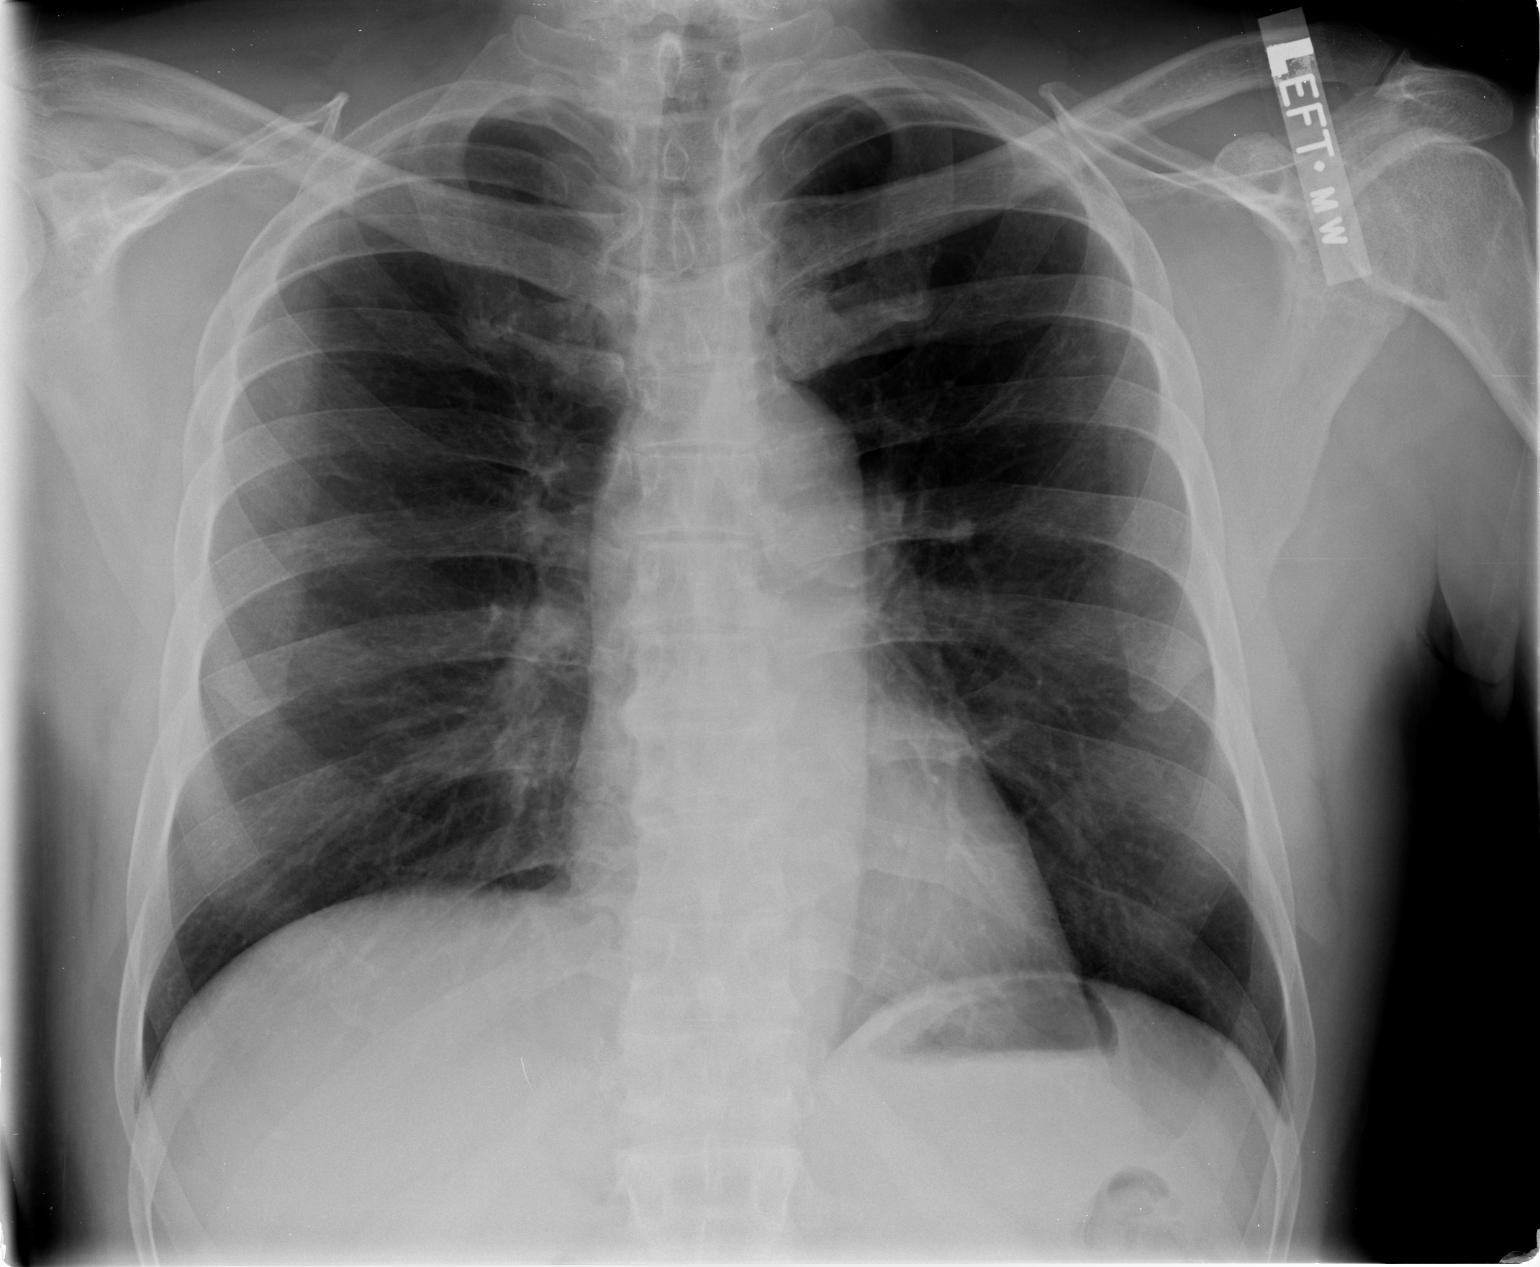

[view not recorded (2 of 2)]
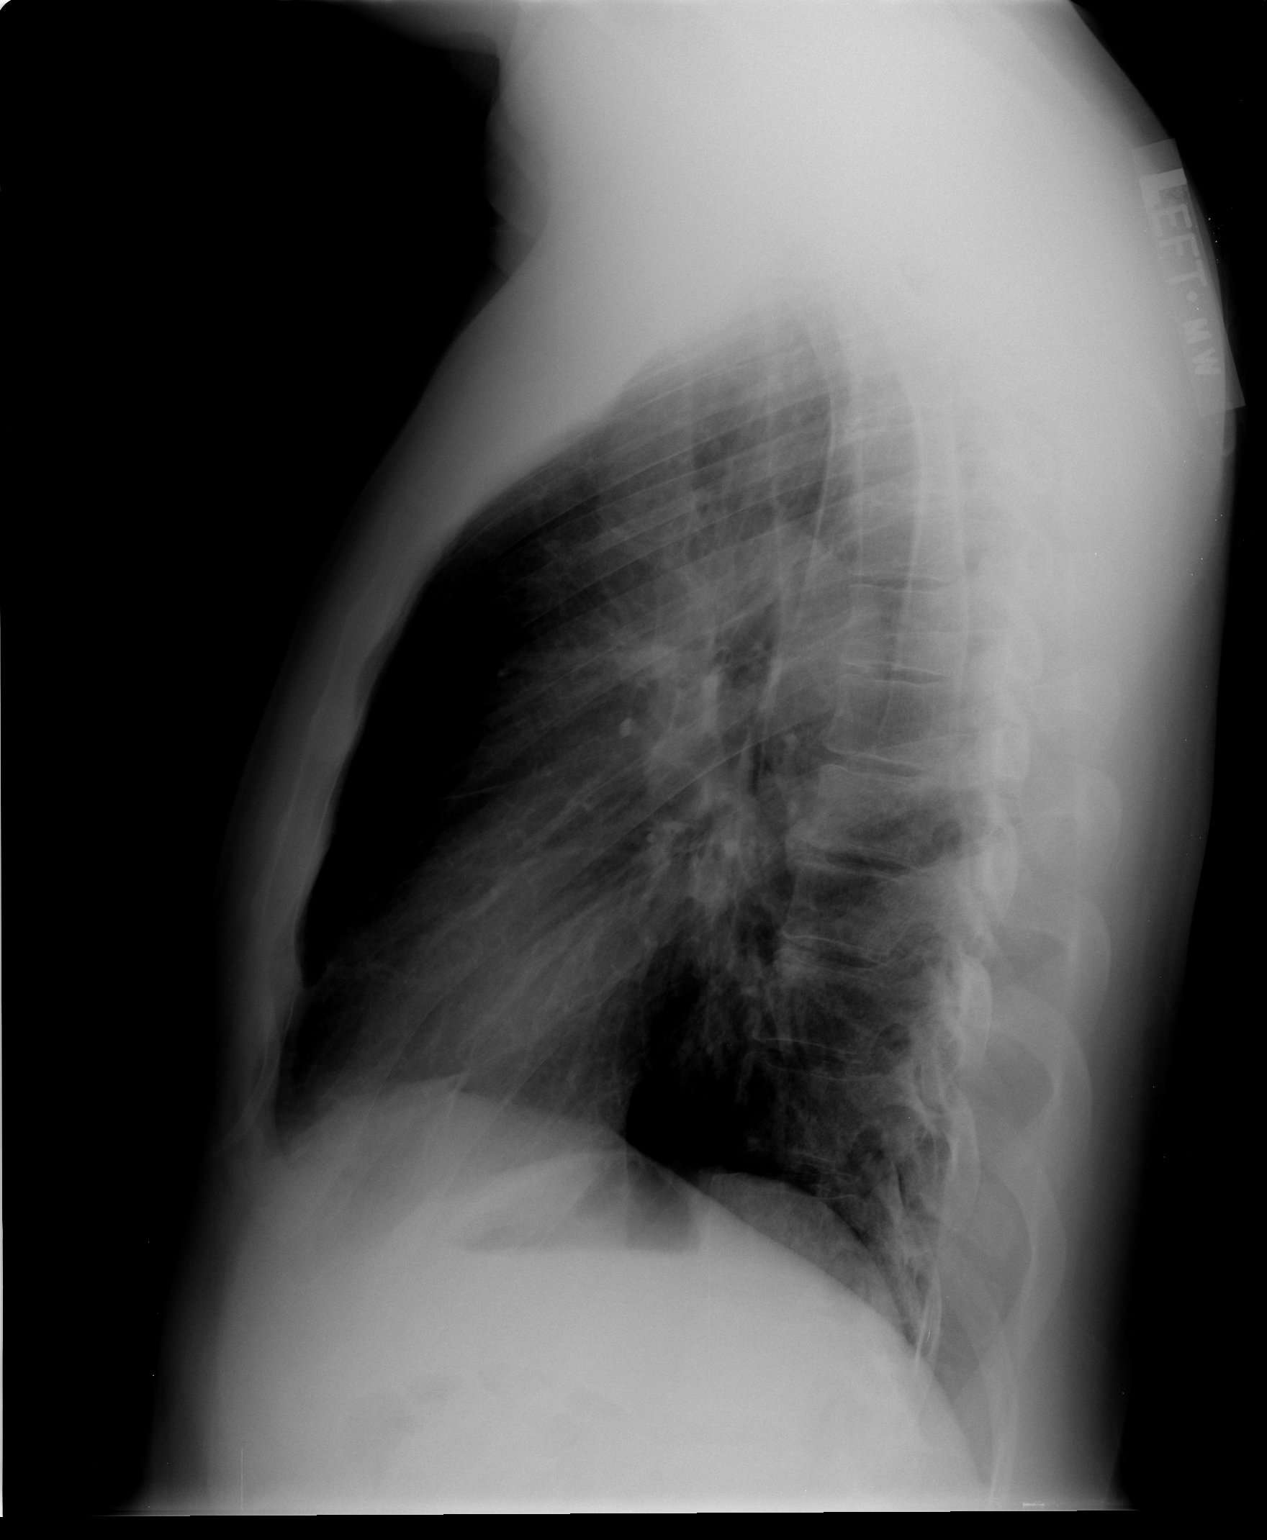

[2 of 2 positions shown; findings below may reference images not displayed]

FINDINGS: Mild elevation of the right hemidiaphragm.  Cardiac size
and mediastinal contours are within normal limits.  Visualized
tracheal air column is within normal limits.  The lungs are clear.
No acute osseous abnormality identified.
IMPRESSION: No acute cardiopulmonary abnormality.

## 2013-08-15 IMAGING — CR DG CERVICAL SPINE 1V
2 series · 2 of 2 positions shown · non-contrast
Comparison: Cervical spine radiographs 08/30/2010.

CLINICAL DATA: Neck pain.  Cervical fusion.

DG SPINE PORTABLE - 1 VIEW

[w c-spine lat]
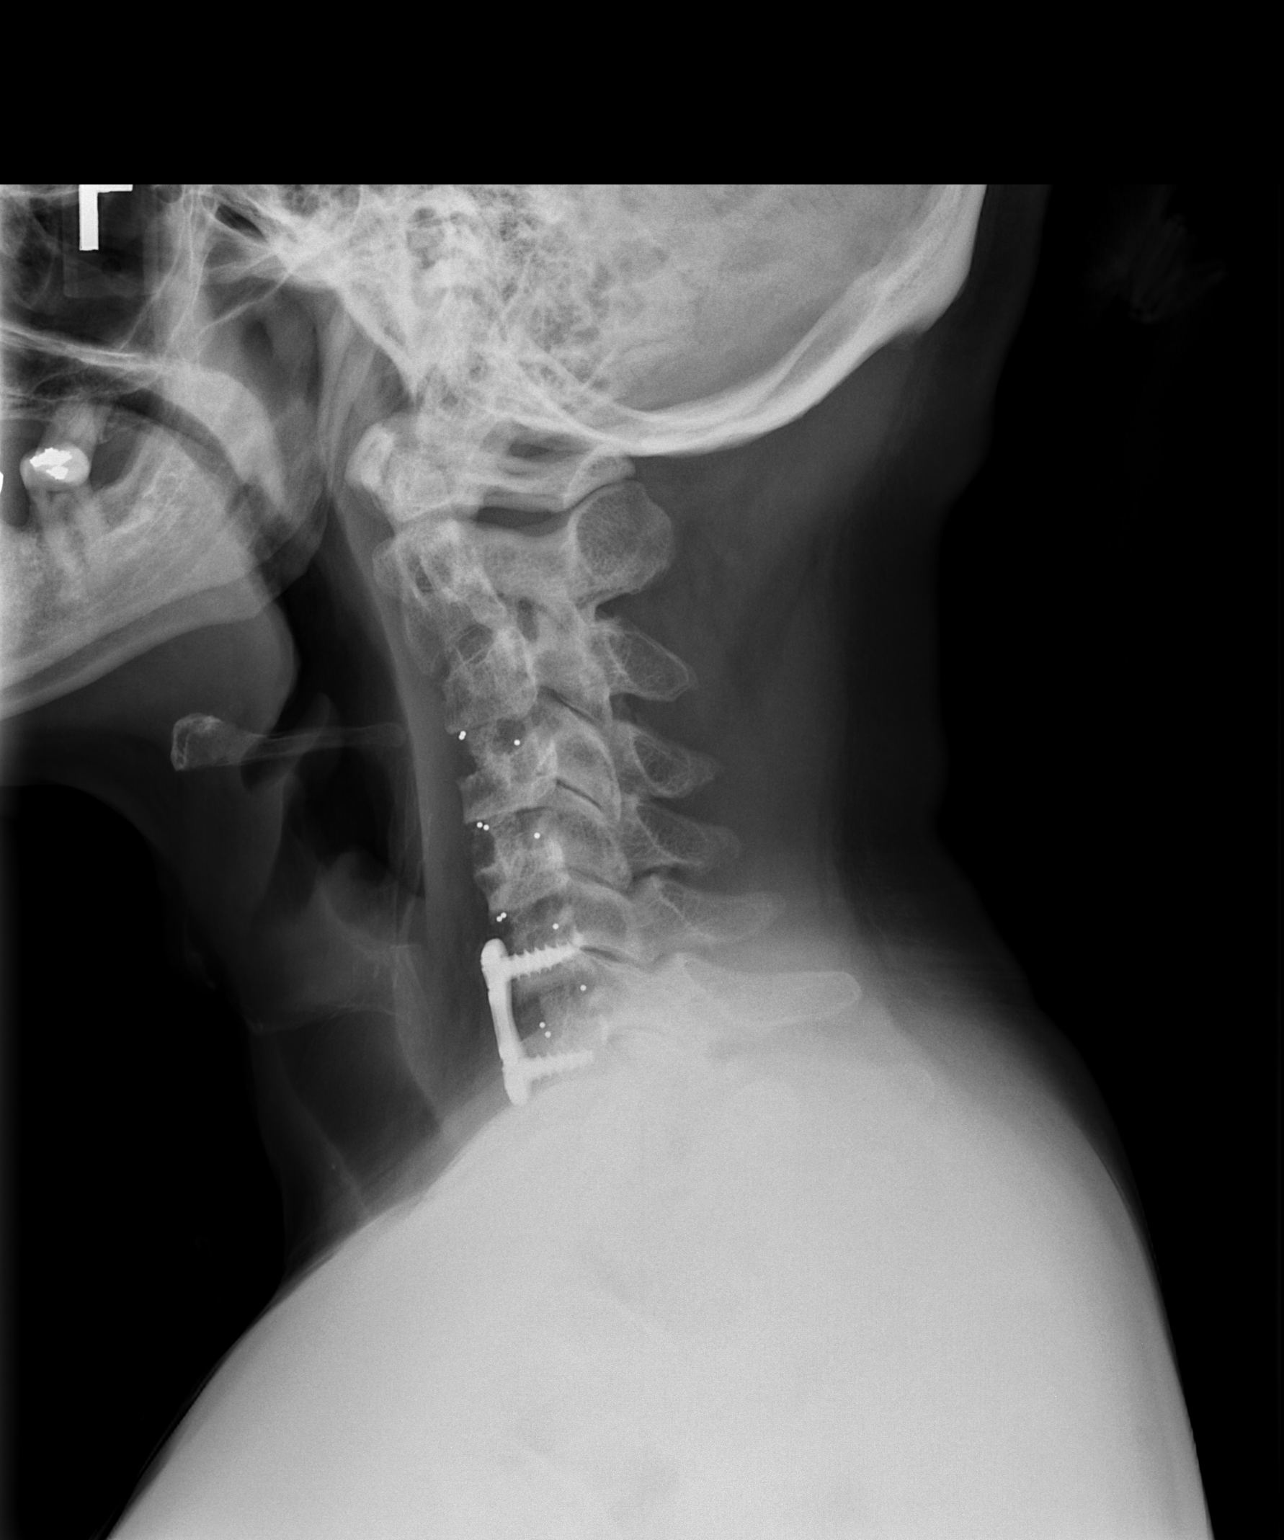

[w swimmers view *]
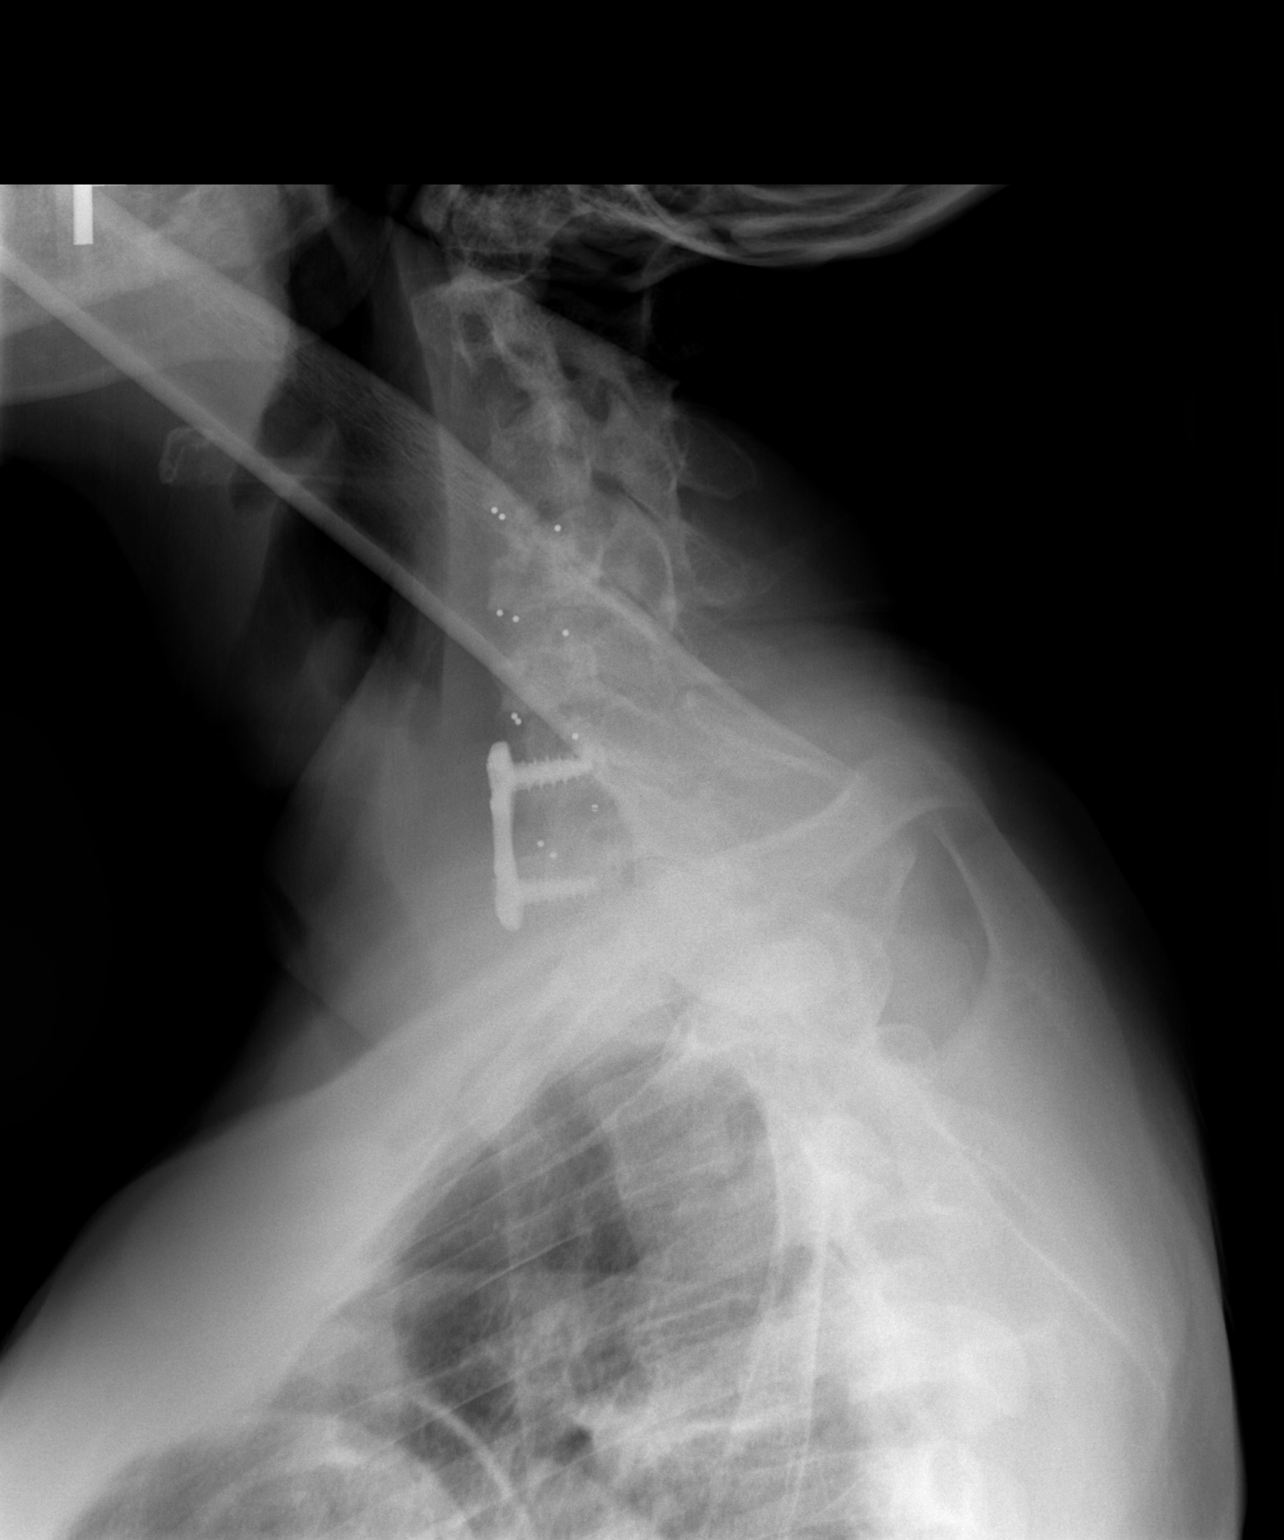

[2 of 2 positions shown; findings below may reference images not displayed]

FINDINGS: Stable cervical fusion hardware noted at C6-7.  Prior
hardware extending from C3-C6 has been removed.  Interbody bone
spacers are stable.  Stable mild reversal of the normal cervical
lordosis.
IMPRESSION: C6-7 fusion hardware in good position without complicating
features.

## 2014-07-13 ENCOUNTER — Other Ambulatory Visit (HOSPITAL_COMMUNITY): Payer: Self-pay | Admitting: Nurse Practitioner

## 2014-07-13 DIAGNOSIS — B182 Chronic viral hepatitis C: Secondary | ICD-10-CM

## 2014-08-08 ENCOUNTER — Telehealth (HOSPITAL_COMMUNITY): Payer: Self-pay

## 2014-08-08 NOTE — Telephone Encounter (Signed)
Called to remind pt of 11am appt at [facility], pt agreed to arrive at 10:45 and stay npo 6 hours prior to exam. AW

## 2014-08-09 ENCOUNTER — Ambulatory Visit (HOSPITAL_COMMUNITY): Payer: Medicaid Other

## 2014-08-29 ENCOUNTER — Telehealth (HOSPITAL_COMMUNITY): Payer: Self-pay

## 2014-08-29 NOTE — Telephone Encounter (Signed)
Called to remind pt of 830am appt in radiology, pt agreed to arrive at 815 and stay npo after midnight tonight. AW

## 2014-08-30 ENCOUNTER — Ambulatory Visit (HOSPITAL_COMMUNITY)
Admission: RE | Admit: 2014-08-30 | Discharge: 2014-08-30 | Disposition: A | Discharge status: Home / Self Care | Payer: Medicaid Other | Source: Ambulatory Visit | Attending: Nurse Practitioner | Admitting: Nurse Practitioner

## 2014-08-30 DIAGNOSIS — B182 Chronic viral hepatitis C: Secondary | ICD-10-CM | POA: Insufficient documentation

## 2014-11-19 ENCOUNTER — Encounter (HOSPITAL_COMMUNITY): Payer: Self-pay | Admitting: Nurse Practitioner

## 2014-11-19 ENCOUNTER — Emergency Department (HOSPITAL_COMMUNITY)
Admission: EM | Admit: 2014-11-19 | Discharge: 2014-11-19 | Disposition: A | Discharge status: Home / Self Care | Payer: Medicaid Other | Attending: Emergency Medicine | Admitting: Emergency Medicine

## 2014-11-19 DIAGNOSIS — Z8619 Personal history of other infectious and parasitic diseases: Secondary | ICD-10-CM | POA: Diagnosis not present

## 2014-11-19 DIAGNOSIS — Z792 Long term (current) use of antibiotics: Secondary | ICD-10-CM | POA: Insufficient documentation

## 2014-11-19 DIAGNOSIS — I10 Essential (primary) hypertension: Secondary | ICD-10-CM | POA: Diagnosis not present

## 2014-11-19 DIAGNOSIS — Z720 Tobacco use: Secondary | ICD-10-CM | POA: Diagnosis not present

## 2014-11-19 DIAGNOSIS — Z79899 Other long term (current) drug therapy: Secondary | ICD-10-CM | POA: Diagnosis not present

## 2014-11-19 DIAGNOSIS — K047 Periapical abscess without sinus: Secondary | ICD-10-CM

## 2014-11-19 DIAGNOSIS — R011 Cardiac murmur, unspecified: Secondary | ICD-10-CM | POA: Diagnosis not present

## 2014-11-19 DIAGNOSIS — K029 Dental caries, unspecified: Secondary | ICD-10-CM | POA: Insufficient documentation

## 2014-11-19 DIAGNOSIS — K140 Glossitis: Secondary | ICD-10-CM | POA: Diagnosis not present

## 2014-11-19 DIAGNOSIS — R600 Localized edema: Secondary | ICD-10-CM | POA: Diagnosis present

## 2014-11-19 HISTORY — DX: Unspecified viral hepatitis C without hepatic coma: B19.20

## 2014-11-19 LAB — I-STAT CHEM 8, ED
BUN: 8 mg/dL (ref 6–20)
CREATININE: 0.9 mg/dL (ref 0.61–1.24)
Calcium, Ion: 1.22 mmol/L (ref 1.12–1.23)
Chloride: 99 mmol/L — ABNORMAL LOW (ref 101–111)
GLUCOSE: 88 mg/dL (ref 65–99)
HCT: 43 % (ref 39.0–52.0)
HEMOGLOBIN: 14.6 g/dL (ref 13.0–17.0)
Potassium: 4.1 mmol/L (ref 3.5–5.1)
Sodium: 138 mmol/L (ref 135–145)
TCO2: 30 mmol/L (ref 0–100)

## 2014-11-19 MED ORDER — FIRST-DUKES MOUTHWASH MT SUSP: 5.0000 mL | Freq: Four times a day (QID) | OROMUCOSAL | Status: DC | PRN

## 2014-11-19 MED ORDER — MAGIC MOUTHWASH
5.0000 mL | Freq: Once | ORAL | Status: AC
  Administered 2014-11-19: 5 mL via ORAL
  Filled 2014-11-19: qty 5

## 2014-11-19 MED ORDER — AMOXICILLIN 500 MG PO CAPS: 500.0000 mg | ORAL_CAPSULE | Freq: Three times a day (TID) | ORAL | Status: DC
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
## 2014-11-19 NOTE — Discharge Instructions (Signed)
Amoxicillin as prescribed until all gone for dental infection. Use mouthwash 5 mL, swish and spit every 4-6 hours. Follow-up with oral surgery.  Oral Ulcers Oral ulcers are painful, shallow sores around the lining of the mouth. They can affect the gums, the inside of the lips, and the cheeks. (Sores on the outside of the lips and on the face are different.) They typically first occur in school-aged children and teenagers. Oral ulcers may also be called canker sores or cold sores. CAUSES  Canker sores and cold sores can be caused by many factors including:  Infection.  Injury.  Sun exposure.  Medications.  Emotional stress.  Food allergies.  Vitamin deficiencies.  Toothpastes containing sodium lauryl sulfate. The herpes virus can be the cause of mouth ulcers. The first infection can be severe and cause 10 or more ulcers on the gums, tongue, and lips with fever and difficulty in swallowing. This infection usually occurs between the ages of 1 and 3 years.  SYMPTOMS  The typical sore is about  inch (6 mm) in size and is an oval or round ulcer with red borders. DIAGNOSIS  Your caregiver can diagnose simple oral ulcers by examination. Additional testing is usually not required.  TREATMENT  Treatment is aimed at pain relief. Generally, oral ulcers resolve by themselves within 1 to 2 weeks without medication and are not contagious unless caused by herpes (and other viruses). Antibiotics are not effective with mouth sores. Avoid direct contact with others until the ulcer is completely healed. See your caregiver for follow-up care as recommended. Also:  Offer a soft diet.  Encourage plenty of fluids to prevent dehydration. Popsicles and milk shakes can be helpful.  Avoid acidic and salty foods and drinks such as orange juice.  Infants and young children will often refuse to drink because of pain. Using a teaspoon, cup, or syringe to give small amounts of fluids frequently can help prevent  dehydration.  Cold compresses on the face may help reduce pain.  Pain medication can help control soreness.  A solution of diphenhydramine mixed with a liquid antacid can be useful to decrease the soreness of ulcers. Consult a caregiver for the dosing.  Liquids or ointments with a numbing ingredient may be helpful when used as recommended.  Older children and teenagers can rinse their mouth with a salt-water mixture (1/2 teaspoon of salt in 8 ounces of water) four times a day. This treatment is uncomfortable but may reduce the time the ulcers are present.  There are many over-the-counter throat lozenges and medications available for oral ulcers. Their effectiveness has not been studied.  Consult your medical caregiver prior to using homeopathic treatments for oral ulcers. SEEK MEDICAL CARE IF:   You think your child needs to be seen.  The pain worsens and you cannot control it.  There are 4 or more ulcers.  The lips and gums begin to bleed and crust.  A single mouth ulcer is near a tooth that is causing a toothache or pain.  Your child has a fever, swollen face, or swollen glands.  The ulcers began after starting a medication.  Mouth ulcers keep reoccurring or last more than 2 weeks.  You think your child is not taking adequate fluids. SEEK IMMEDIATE MEDICAL CARE IF:   Your child has a high fever.  Your child is unable to swallow or becomes dehydrated.  Your child looks or acts very ill.  An ulcer caused by a chemical your child accidentally put in their  mouth. Document Released: 03/28/2004 Document Revised: 07/05/2013 Document Reviewed: 11/10/2008 [facility] Patient Information 2015 [place], [place]. This information is not intended to replace advice given to you by your health care provider. Make sure you discuss any questions you have with your health care provider.

## 2014-11-19 NOTE — ED Notes (Addendum)
He c/o painful swelling to R side of tongue x 3 days. He denies using new medications or products. He states it hurts to swallow. He denies trouble breathing. No facial swelling. Breathing easily

## 2014-11-20 LAB — RPR: RPR: NONREACTIVE

## 2014-11-20 LAB — HIV ANTIBODY (ROUTINE TESTING W REFLEX): HIV SCREEN 4TH GENERATION: NONREACTIVE

## 2015-05-31 ENCOUNTER — Other Ambulatory Visit: Payer: Self-pay | Admitting: Nurse Practitioner

## 2015-05-31 DIAGNOSIS — K7469 Other cirrhosis of liver: Secondary | ICD-10-CM

## 2015-06-07 ENCOUNTER — Ambulatory Visit
Admission: RE | Admit: 2015-06-07 | Discharge: 2015-06-07 | Disposition: A | Discharge status: Home / Self Care | Payer: Medicaid Other | Source: Ambulatory Visit | Attending: Nurse Practitioner | Admitting: Nurse Practitioner

## 2015-06-07 DIAGNOSIS — K7469 Other cirrhosis of liver: Secondary | ICD-10-CM

## 2015-06-22 ENCOUNTER — Ambulatory Visit
Admission: RE | Admit: 2015-06-22 | Discharge: 2015-06-22 | Disposition: A | Discharge status: Home / Self Care | Payer: Medicaid Other | Source: Ambulatory Visit | Attending: Family Medicine | Admitting: Family Medicine

## 2015-06-22 ENCOUNTER — Other Ambulatory Visit: Payer: Self-pay | Admitting: Family Medicine

## 2015-06-22 DIAGNOSIS — M130 Polyarthritis, unspecified: Secondary | ICD-10-CM

## 2015-07-10 ENCOUNTER — Encounter (HOSPITAL_COMMUNITY): Payer: Self-pay | Admitting: *Deleted

## 2015-07-10 ENCOUNTER — Emergency Department (HOSPITAL_COMMUNITY)
Admission: EM | Admit: 2015-07-10 | Discharge: 2015-07-10 | Disposition: A | Discharge status: Home / Self Care | Payer: Medicaid Other | Attending: Emergency Medicine | Admitting: Emergency Medicine

## 2015-07-10 DIAGNOSIS — F17210 Nicotine dependence, cigarettes, uncomplicated: Secondary | ICD-10-CM | POA: Insufficient documentation

## 2015-07-10 DIAGNOSIS — K029 Dental caries, unspecified: Secondary | ICD-10-CM | POA: Diagnosis not present

## 2015-07-10 DIAGNOSIS — R011 Cardiac murmur, unspecified: Secondary | ICD-10-CM | POA: Insufficient documentation

## 2015-07-10 DIAGNOSIS — K0889 Other specified disorders of teeth and supporting structures: Secondary | ICD-10-CM | POA: Insufficient documentation

## 2015-07-10 DIAGNOSIS — Z79899 Other long term (current) drug therapy: Secondary | ICD-10-CM | POA: Insufficient documentation

## 2015-07-10 DIAGNOSIS — Z792 Long term (current) use of antibiotics: Secondary | ICD-10-CM | POA: Diagnosis not present

## 2015-07-10 DIAGNOSIS — Z8619 Personal history of other infectious and parasitic diseases: Secondary | ICD-10-CM | POA: Insufficient documentation

## 2015-07-10 DIAGNOSIS — Z87828 Personal history of other (healed) physical injury and trauma: Secondary | ICD-10-CM | POA: Insufficient documentation

## 2015-07-10 DIAGNOSIS — I10 Essential (primary) hypertension: Secondary | ICD-10-CM | POA: Insufficient documentation

## 2015-07-10 MED ORDER — IBUPROFEN 800 MG PO TABS: 800.0000 mg | ORAL_TABLET | Freq: Three times a day (TID) | ORAL | Status: AC

## 2015-07-10 MED ORDER — PENICILLIN V POTASSIUM 500 MG PO TABS: 500.0000 mg | ORAL_TABLET | Freq: Three times a day (TID) | ORAL | Status: AC

## 2015-07-10 NOTE — ED Notes (Signed)
Pt is here with dental pain to lower row of teeth.  PT thinks he needs to have them all removed because he has been having pain to his teeth for over 3 months.
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
## 2015-07-10 NOTE — ED Notes (Signed)
Declined W/C at D/C and was escorted to lobby by RN. 

## 2015-07-10 NOTE — Discharge Instructions (Signed)

## 2016-06-08 ENCOUNTER — Emergency Department (HOSPITAL_COMMUNITY): Payer: Medicaid Other

## 2016-06-08 ENCOUNTER — Emergency Department (HOSPITAL_COMMUNITY)
Admission: EM | Admit: 2016-06-08 | Discharge: 2016-06-08 | Disposition: A | Discharge status: Home / Self Care | Payer: Medicaid Other | Attending: Emergency Medicine | Admitting: Emergency Medicine

## 2016-06-08 ENCOUNTER — Encounter (HOSPITAL_COMMUNITY): Payer: Self-pay

## 2016-06-08 DIAGNOSIS — S0990XA Unspecified injury of head, initial encounter: Secondary | ICD-10-CM | POA: Diagnosis present

## 2016-06-08 DIAGNOSIS — Z79899 Other long term (current) drug therapy: Secondary | ICD-10-CM | POA: Diagnosis not present

## 2016-06-08 DIAGNOSIS — F17200 Nicotine dependence, unspecified, uncomplicated: Secondary | ICD-10-CM | POA: Insufficient documentation

## 2016-06-08 DIAGNOSIS — Y999 Unspecified external cause status: Secondary | ICD-10-CM | POA: Insufficient documentation

## 2016-06-08 DIAGNOSIS — Y92009 Unspecified place in unspecified non-institutional (private) residence as the place of occurrence of the external cause: Secondary | ICD-10-CM | POA: Insufficient documentation

## 2016-06-08 DIAGNOSIS — I10 Essential (primary) hypertension: Secondary | ICD-10-CM | POA: Insufficient documentation

## 2016-06-08 DIAGNOSIS — S0003XA Contusion of scalp, initial encounter: Secondary | ICD-10-CM | POA: Insufficient documentation

## 2016-06-08 DIAGNOSIS — M542 Cervicalgia: Secondary | ICD-10-CM | POA: Diagnosis not present

## 2016-06-08 DIAGNOSIS — Y939 Activity, unspecified: Secondary | ICD-10-CM | POA: Insufficient documentation

## 2016-06-08 LAB — BASIC METABOLIC PANEL
ANION GAP: 11 (ref 5–15)
BUN: 10 mg/dL (ref 6–20)
CHLORIDE: 99 mmol/L — AB (ref 101–111)
CO2: 29 mmol/L (ref 22–32)
Calcium: 9.2 mg/dL (ref 8.9–10.3)
Creatinine, Ser: 0.91 mg/dL (ref 0.61–1.24)
GFR calc Af Amer: >60 mL/min (ref >60)
GLUCOSE: 111 mg/dL — AB (ref 65–99)
POTASSIUM: 3.5 mmol/L (ref 3.5–5.1)
Sodium: 139 mmol/L (ref 135–145)

## 2016-06-08 LAB — CBC WITH DIFFERENTIAL/PLATELET
BASOS ABS: 0 10*3/uL (ref 0.0–0.1)
Basophils Relative: 0 %
EOS PCT: 1 %
Eosinophils Absolute: 0.1 10*3/uL (ref 0.0–0.7)
HEMATOCRIT: 37.9 % — AB (ref 39.0–52.0)
HEMOGLOBIN: 13 g/dL (ref 13.0–17.0)
LYMPHS ABS: 1.9 10*3/uL (ref 0.7–4.0)
LYMPHS PCT: 22 %
MCH: 30.4 pg (ref 26.0–34.0)
MCHC: 34.3 g/dL (ref 30.0–36.0)
MCV: 88.6 fL (ref 78.0–100.0)
MONO ABS: 0.7 10*3/uL (ref 0.1–1.0)
MONOS PCT: 8 %
NEUTROS ABS: 6.2 10*3/uL (ref 1.7–7.7)
Neutrophils Relative %: 69 %
Platelets: 217 10*3/uL (ref 150–400)
RBC: 4.28 MIL/uL (ref 4.22–5.81)
RDW: 12.5 % (ref 11.5–15.5)
WBC: 8.8 10*3/uL (ref 4.0–10.5)

## 2016-06-08 MED ORDER — HYDROCODONE-ACETAMINOPHEN 5-325 MG PO TABS
2.0000 | ORAL_TABLET | Freq: Once | ORAL | Status: DC
  Filled 2016-06-08: qty 2

## 2016-06-08 MED ORDER — CYCLOBENZAPRINE HCL 10 MG PO TABS: 10.0000 mg | ORAL_TABLET | Freq: Two times a day (BID) | ORAL | 0 refills | Status: AC | PRN

## 2016-06-08 MED ORDER — HYDROCODONE-ACETAMINOPHEN 5-325 MG PO TABS
2.0000 | ORAL_TABLET | Freq: Once | ORAL | Status: AC
  Administered 2016-06-08: 2 via ORAL
  Filled 2016-06-08: qty 2
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
## 2016-06-08 NOTE — ED Notes (Signed)
Patient transported to CT 
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
## 2016-06-08 NOTE — ED Notes (Signed)
Packages already opened for giving vicodin due to pt pain, pt then refused and said that medication would not help the pain. Witnessed medications placed in sharps container by Johna Roles.

## 2021-01-02 DEATH — deceased
